# Patient Record
Sex: Male | Born: 1995 | Race: White | Hispanic: No | Marital: Single | Smoking: Never smoker
Health system: Southern US, Community
[De-identification: ages and names within clinical notes are randomized; demographics above are authoritative.]

---
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
## 2017-01-15 ENCOUNTER — Ambulatory Visit (INDEPENDENT_AMBULATORY_CARE_PROVIDER_SITE_OTHER): Payer: 59 | Admitting: Family Medicine

## 2017-01-15 ENCOUNTER — Encounter: Payer: Self-pay | Admitting: Family Medicine

## 2017-01-15 VITALS — BP 118/78 | HR 99 | Temp 98.2°F | Resp 18 | Ht 67.5 in | Wt 156.6 lb

## 2017-01-15 DIAGNOSIS — Z113 Encounter for screening for infections with a predominantly sexual mode of transmission: Secondary | ICD-10-CM

## 2017-01-15 DIAGNOSIS — Z0000 Encounter for general adult medical examination without abnormal findings: Secondary | ICD-10-CM | POA: Diagnosis not present

## 2017-01-15 DIAGNOSIS — R197 Diarrhea, unspecified: Secondary | ICD-10-CM | POA: Diagnosis not present

## 2017-01-15 NOTE — Progress Notes (Signed)
Name: Fernando Solomon   MRN: [id number]    DOB: 07-Sep-1996   Date:01/15/2017       Progress Note  Subjective  Chief Complaint  Chief Complaint  Patient presents with  . Annual Exam  . Flu Vaccine    pt refused  . Diarrhea    HPI  Male Exam: he is not currently sexually active, he had one sexual partner in the past - first intercourse at age 21, but not currently dating. He states she was a virgin. No penile discharge, no rashes, no dysuria or blood in urine.   Diarrhea: he was feeling well [place]. He never had nausea or vomiting or abdominal pain, he noticed watery stools up to three times a day, he still has episodes of bowel movements during the night. He went to Urgent Care near his college, had stools studies and blood work that per patient was negative, but he continues to have symptoms. He states his roommate is now having same symptoms. 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   There are no active problems to display for this patient.   History reviewed. No pertinent surgical history.  Family History  Problem Relation Age of Onset  . Diabetes Father   . Autism Brother     Social History   Social History  . Marital status: Single    Spouse name: N/A  . Number of children: N/A  . Years of education: N/A   Occupational History  . Not on file.   Social History Main Topics  . Smoking status: Never Smoker  . Smokeless tobacco: Never Used  . Alcohol use No  . Drug use: No     Comment: tried pot  . Sexual activity: Not Currently    Birth control/ protection: Condom   Other Topics Concern  . Not on file   Social History Narrative   Started college Fall 2017 [facility] [employer]   Major in biology and minor in agriculture    No current outpatient prescriptions on file.  No Known Allergies   ROS  Constitutional:  Negative for fever or weight change.  Respiratory: Negative for cough and shortness of breath.   Cardiovascular: Negative for chest pain or palpitations.  Gastrointestinal: Negative for abdominal pain, change in  bowel changes.  Musculoskeletal: Negative for gait problem or joint swelling.  Skin: Negative for rash.  Neurological: Negative for dizziness or headache.  No other specific complaints in a complete review of systems (except as listed in HPI above).  Objective  Vitals:   01/15/17 1149  BP: 118/78  Pulse: 99  Resp: 18  Temp: 98.2 F (36.8 C)  SpO2: 98%  Weight: 156 lb 9 oz (71 kg)  Height: 5' 7.5" (1.715 m)    Body mass index is 24.16 kg/m.  Physical Exam  Constitutional: Patient appears well-developed and well-nourished. No distress.  HENT: Head: Normocephalic and atraumatic. Ears: B TMs ok, no erythema or effusion; Nose: Nose normal. Mouth/Throat: Oropharynx is clear and moist. No oropharyngeal exudate.  Eyes: Conjunctivae and EOM are normal. Pupils are equal, round, and reactive to light. No scleral icterus.  Neck: Normal range of motion. Neck supple. No JVD present. No thyromegaly present.  Cardiovascular: Normal rate, regular rhythm and normal heart sounds.  No murmur heard. No BLE edema. Pulmonary/Chest: Effort normal and breath  sounds normal. No respiratory distress. Abdominal: Soft. Bowel sounds are normal, no distension. There is no tenderness. no masses MALE GENITALIA: Normal descended testes bilaterally, no masses palpated, no hernias, no lesions, no discharge RECTAL: not done Musculoskeletal: Normal range of motion, no joint effusions. No gross deformities Neurological: he is alert and oriented to person, place, and time. No cranial nerve deficit. Coordination, balance, strength, speech and gait are normal.  Skin: Skin is warm and dry. No rash noted. No erythema.  Psychiatric: Patient has a normal mood and affect. behavior is normal. Judgment and thought  content normal.  PHQ2/9:  Depression screen PHQ 2/9 01/15/2017  Decreased Interest 0  Down, Depressed, Hopeless 0  PHQ - 2 Score 0  Altered sleeping 0  Tired, decreased energy 0  Change in appetite 0  Feeling bad or failure about yourself  0  Trouble concentrating 0  Moving slowly or fidgety/restless 0  Suicidal thoughts 0  PHQ-9 Score 0      Assessment & Plan  1. Annual physical exam  Discussed importance of 150 minutes of physical activity weekly, eat two servings of fish weekly, eat one serving of tree nuts ( cashews, pistachios, pecans, almonds.Marland Kitchen) every other day, eat 6 servings of fruit/vegetables daily and drink plenty of water and avoid sweet beverages.   2. Routine screening for STI (sexually transmitted infection)  - HIV antibody - RPR - GC Probe amplification, urine   3. Diarrhea, unspecified type  He will mail or fax me a copy of his blood work and stool studies done at Urgent care, we will get DRG done and treat with empirical antibiotics if studies negative.   Discussed TSH, celiac disease and CBC but he would like to hold off for now

## 2017-01-16 LAB — GC/CHLAMYDIA PROBE AMP
CT PROBE, AMP APTIMA: NOT DETECTED
GC Probe RNA: NOT DETECTED

## 2017-01-17 ENCOUNTER — Encounter: Payer: Self-pay | Admitting: Family Medicine

## 2017-01-24 DIAGNOSIS — K591 Functional diarrhea: Secondary | ICD-10-CM | POA: Diagnosis not present

## 2017-03-03 ENCOUNTER — Other Ambulatory Visit: Payer: Self-pay | Admitting: Family Medicine

## 2017-03-03 ENCOUNTER — Telehealth: Payer: Self-pay | Admitting: Family Medicine

## 2017-03-03 DIAGNOSIS — R197 Diarrhea, unspecified: Secondary | ICD-10-CM

## 2017-03-03 NOTE — Telephone Encounter (Signed)
Patient would like a referral to see GI Specialist Dr. Mechele Collin for bowel issues.  He is still experiencing diarrhea.

## 2017-04-08 DIAGNOSIS — K529 Noninfective gastroenteritis and colitis, unspecified: Secondary | ICD-10-CM | POA: Diagnosis not present

## 2017-09-11 ENCOUNTER — Other Ambulatory Visit: Payer: Self-pay | Admitting: Family Medicine

## 2017-09-12 ENCOUNTER — Encounter: Payer: Self-pay | Admitting: Family Medicine

## 2018-03-16 ENCOUNTER — Encounter: Payer: Self-pay | Admitting: Family Medicine

## 2018-03-16 ENCOUNTER — Ambulatory Visit (INDEPENDENT_AMBULATORY_CARE_PROVIDER_SITE_OTHER): Payer: 59 | Admitting: Family Medicine

## 2018-03-16 VITALS — BP 100/60 | HR 87 | Temp 98.3°F | Resp 14 | Ht 67.0 in | Wt 147.0 lb

## 2018-03-16 DIAGNOSIS — Z0000 Encounter for general adult medical examination without abnormal findings: Secondary | ICD-10-CM | POA: Diagnosis not present

## 2018-03-16 DIAGNOSIS — A64 Unspecified sexually transmitted disease: Secondary | ICD-10-CM | POA: Diagnosis not present

## 2018-03-16 DIAGNOSIS — E739 Lactose intolerance, unspecified: Secondary | ICD-10-CM | POA: Insufficient documentation

## 2018-03-16 DIAGNOSIS — Z9189 Other specified personal risk factors, not elsewhere classified: Secondary | ICD-10-CM | POA: Insufficient documentation

## 2018-03-16 DIAGNOSIS — Z23 Encounter for immunization: Secondary | ICD-10-CM

## 2018-03-16 NOTE — Progress Notes (Signed)
Name: Fernando Solomon   MRN: [id number]    DOB: 1996/09/05   Date:03/16/2018       Progress Note  Subjective  Chief Complaint  Chief Complaint  Patient presents with  . Annual Exam    HPI  Patient presents for annual CPE   USPSTF grade A and B recommendations:  Diet: balanced Exercise: advised to increase when on campus, very active during the Summer  Depression:  Depression screen [facility] 2/9 03/16/2018 01/15/2017  Decreased Interest 0 0  Down, Depressed, Hopeless 0 0  PHQ - 2 Score 0 0  Altered sleeping - 0  Tired, decreased energy - 0  Change in appetite - 0  Feeling bad or failure about yourself  - 0  Trouble concentrating - 0  Moving slowly or fidgety/restless - 0  Suicidal thoughts - 0  PHQ-9 Score - 0    Hypertension:  BP Readings from Last 3 Encounters:  03/16/18 100/60  01/15/17 118/78  09/28/15 (!) 98/62 (4 %, Z = -1.80 /  27 %, Z = -0.61)*   *BP percentiles are based on the August 2017 AAP Clinical Practice Guideline for boys    Obesity: Wt Readings from Last 3 Encounters:  03/16/18 147 lb (66.7 kg)  01/15/17 156 lb 9 oz (71 kg) (57 %, Z= 0.18)*  09/28/15 138 lb 5 oz (62.7 kg) (36 %, Z= -0.35)*   * Growth percentiles are based on CDC (Boys, 2-20 Years) data.   BMI Readings from Last 3 Encounters:  03/16/18 23.02 kg/m  01/15/17 24.16 kg/m (70 %, Z= 0.52)*  09/28/15 21.66 kg/m (50 %, Z= 0.01)*   * Growth percentiles are based on CDC (Boys, 2-20 Years) data.      Alcohol: discussed avoidance of drinking and driving, use Uber Tobacco use: discussed   Single STD testing and prevention (chl/gon/syphilis): today  HIV, hep C: check today , discussed importance of condom  Advanced Care Planning: A voluntary discussion about advance care planning including the explanation and discussion of advance directives.  Discussed health care proxy and Living will, and the patient was able to identify a health care proxy as parents  Patient does not have a living  will at present time.    Patient Active Problem List   Diagnosis Date Noted  . At risk for HIV due to homosexual contact 03/16/2018  . Lactose intolerance in adult 03/16/2018    History reviewed. No pertinent surgical history.  Family History  Problem Relation Age of Onset  . Diabetes Father   . Autism Brother     Social History   Socioeconomic History  . Marital status: Single    Spouse name: 0  . Number of children: 0  . Years of education: Not on file  . Highest education level: Some college, no degree  Occupational History  . Occupation: [employer]  . Financial resource strain: Not hard at all  . Food insecurity:    Worry: Never true    Inability: Never true  . Transportation needs:    Medical: No    Non-medical: No  Tobacco Use  . Smoking status: Never Smoker  . Smokeless tobacco: Current User    Types: Snuff  Substance and Sexual Activity  . Alcohol use: No    Alcohol/week: 0.0 oz  . Drug use: No    Comment: tried pot  . Sexual activity: Not Currently    Partners: Male    Birth control/protection: Condom  Lifestyle  . Physical  activity:    Days per week: 7 days    Minutes per session: 150+ min  . Stress: Not at all  Relationships  . Social connections:    Talks on phone: More than three times a week    Gets together: More than three times a week    Attends religious service: More than 4 times per year    Active member of club or organization: Yes    Attends meetings of clubs or organizations: More than 4 times per year    Relationship status: Never married  . Intimate partner violence:    Fear of current or ex partner: No    Emotionally abused: No    Physically abused: No    Forced sexual activity: No  Other Topics Concern  . Not on file  Social History Narrative   Started college Fall 2017 at [employer]  - [employer]   Major in biology and minor in agriculture    No current outpatient medications on file.  Allergies   Allergen Reactions  . Lactose Intolerance (Gi) Other (See Comments)     ROS   Constitutional: Negative for fever or weight change.  Respiratory: Negative for cough and shortness of breath.   Cardiovascular: Negative for chest pain or palpitations.  Gastrointestinal: Negative for abdominal pain, no bowel changes.  Musculoskeletal: Negative for gait problem or joint swelling.  Skin: Negative for rash.  Neurological: Negative for dizziness or headache.  No other specific complaints in a complete review of systems (except as listed in HPI above).   Objective  Vitals:   03/16/18 1024  BP: 100/60  Pulse: 87  Resp: 14  Temp: 98.3 F (36.8 C)  TempSrc: Oral  SpO2: 98%  Weight: 147 lb (66.7 kg)  Height:  (1.702 m)    Body mass index is 23.02 kg/m.  Physical Exam  Constitutional: Patient appears well-developed and well-nourished. No distress.  HENT: Head: Normocephalic and atraumatic. Ears: B TMs ok, no erythema or effusion; Nose: Nose normal. Mouth/Throat: Oropharynx is clear and moist. No oropharyngeal exudate.  Eyes: Conjunctivae and EOM are normal. Pupils are equal, round, and reactive to light. No scleral icterus.  Neck: Normal range of motion. Neck supple. No JVD present. No thyromegaly present.  Cardiovascular: Normal rate, regular rhythm and normal heart sounds.  No murmur heard. No BLE edema. Pulmonary/Chest: Effort normal and breath sounds normal. No respiratory distress. Abdominal: Soft. Bowel sounds are normal, no distension. There is no tenderness. no masses MALE GENITALIA: Normal descended testes bilaterally, no masses palpated, no hernias, no lesions, no discharge RECTAL: not done Musculoskeletal: Normal range of motion, no joint effusions. No gross deformities Neurological: he is alert and oriented to person, place, and time. No cranial nerve deficit. Coordination, balance, strength, speech and gait are normal.  Skin: Skin is warm and dry. No rash noted.  No erythema.  Psychiatric: Patient has a normal mood and affect. behavior is normal. Judgment and thought content normal.    PHQ2/9: Depression screen [facility] 2/9 03/16/2018 01/15/2017  Decreased Interest 0 0  Down, Depressed, Hopeless 0 0  PHQ - 2 Score 0 0  Altered sleeping - 0  Tired, decreased energy - 0  Change in appetite - 0  Feeling bad or failure about yourself  - 0  Trouble concentrating - 0  Moving slowly or fidgety/restless - 0  Suicidal thoughts - 0  PHQ-9 Score - 0     Fall Risk: Fall Risk  03/16/2018  Falls  in the past year? No     Functional Status Survey: Is the patient deaf or have difficulty hearing?: No Does the patient have difficulty seeing, even when wearing glasses/contacts?: No Does the patient have difficulty concentrating, remembering, or making decisions?: No Does the patient have difficulty walking or climbing stairs?: No Does the patient have difficulty dressing or bathing?: No Does the patient have difficulty doing errands alone such as visiting a doctor's office or shopping?: No   Assessment & Plan  1. Encounter for routine history and physical exam for male  Discussed importance of 150 minutes of physical activity weekly, eat two servings of fish weekly, eat one serving of tree nuts ( cashews, pistachios, pecans, almonds.Marland Kitchen) every other day, eat 6 servings of fruit/vegetables daily and drink plenty of water and avoid sweet beverages.   2. At risk for HIV due to homosexual contact  Discussed medication if he has exposure, also discussed symptoms if he gets exposed   3. STI (sexually transmitted infection)  - HIV antibody - RPR - Hepatitis, Acute - C. trachomatis/N. gonorrhoeae RNA  4. Need for HPV vaccination  - HPV 9-valent vaccine,Recombinat  5. Need for Tdap vaccination  - Tdap vaccine greater than or equal to 7yo IM  6. Lactose intolerance in adult  Discussed lactaid and avoidance, but needs to have other sources of calcium

## 2018-03-16 NOTE — Patient Instructions (Signed)
Preventive Care 18-39 Years, Male Preventive care refers to lifestyle choices and visits with your health care provider that can promote health and wellness. What does preventive care include?  A yearly physical exam. This is also called an annual well check.  Dental exams once or twice a year.  Routine eye exams. Ask your health care provider how often you should have your eyes checked.  Personal lifestyle choices, including: ? Daily care of your teeth and gums. ? Regular physical activity. ? Eating a healthy diet. ? Avoiding tobacco and drug use. ? Limiting alcohol use. ? Practicing safe sex. What happens during an annual well check? The services and screenings done by your health care provider during your annual well check will depend on your age, overall health, lifestyle risk factors, and family history of disease. Counseling Your health care provider may ask you questions about your:  Alcohol use.  Tobacco use.  Drug use.  Emotional well-being.  Home and relationship well-being.  Sexual activity.  Eating habits.  Work and work Statistician.  Screening You may have the following tests or measurements:  Height, weight, and BMI.  Blood pressure.  Lipid and cholesterol levels. These may be checked every 5 years starting at age 34.  Diabetes screening. This is done by checking your blood sugar (glucose) after you have not eaten for a while (fasting).  Skin check.  Hepatitis C blood test.  Hepatitis B blood test.  Sexually transmitted disease (STD) testing.  Discuss your test results, treatment options, and if necessary, the need for more tests with your health care provider. Vaccines Your health care provider may recommend certain vaccines, such as:  Influenza vaccine. This is recommended every year.  Tetanus, diphtheria, and acellular pertussis (Tdap, Td) vaccine. You may need a Td booster every 10 years.  Varicella vaccine. You may need this if you  have not been vaccinated.  HPV vaccine. If you are 23 or younger, you may need three doses over 6 months.  Measles, mumps, and rubella (MMR) vaccine. You may need at least one dose of MMR.You may also need a second dose.  Pneumococcal 13-valent conjugate (PCV13) vaccine. You may need this if you have certain conditions and have not been vaccinated.  Pneumococcal polysaccharide (PPSV23) vaccine. You may need one or two doses if you smoke cigarettes or if you have certain conditions.  Meningococcal vaccine. One dose is recommended if you are age 65-21 years and a first-year college student living in a residence hall, or if you have one of several medical conditions. You may also need additional booster doses.  Hepatitis A vaccine. You may need this if you have certain conditions or if you travel or work in places where you may be exposed to hepatitis A.  Hepatitis B vaccine. You may need this if you have certain conditions or if you travel or work in places where you may be exposed to hepatitis B.  Haemophilus influenzae type b (Hib) vaccine. You may need this if you have certain risk factors.  Talk to your health care provider about which screenings and vaccines you need and how often you need them. This information is not intended to replace advice given to you by your health care provider. Make sure you discuss any questions you have with your health care provider. Document Released: 12/24/2001 Document Revised: 07/17/2016 Document Reviewed: 08/29/2015 Elsevier Interactive Patient Education  [employer].

## 2018-03-17 LAB — HEPATITIS PANEL, ACUTE
HEP B C IGM: NONREACTIVE
Hep A IgM: NONREACTIVE
Hepatitis B Surface Ag: NONREACTIVE
Hepatitis C Ab: NONREACTIVE
SIGNAL TO CUT-OFF: 0.01 (ref <1.00)

## 2018-03-17 LAB — RPR: RPR Ser Ql: NONREACTIVE

## 2018-03-17 LAB — C. TRACHOMATIS/N. GONORRHOEAE RNA
C. TRACHOMATIS RNA, TMA: NOT DETECTED
N. gonorrhoeae RNA, TMA: NOT DETECTED

## 2018-03-17 LAB — HIV ANTIBODY (ROUTINE TESTING W REFLEX): HIV: NONREACTIVE

## 2018-05-18 ENCOUNTER — Ambulatory Visit (INDEPENDENT_AMBULATORY_CARE_PROVIDER_SITE_OTHER): Payer: 59

## 2018-05-18 DIAGNOSIS — Z23 Encounter for immunization: Secondary | ICD-10-CM

## 2018-05-18 NOTE — Progress Notes (Signed)
hp

## 2018-07-06 ENCOUNTER — Encounter: Payer: Self-pay | Admitting: Family Medicine

## 2018-07-07 ENCOUNTER — Other Ambulatory Visit: Payer: Self-pay | Admitting: Family Medicine

## 2018-07-07 DIAGNOSIS — Z130 Encounter for screening for diseases of the blood and blood-forming organs and certain disorders involving the immune mechanism: Secondary | ICD-10-CM

## 2018-07-14 ENCOUNTER — Telehealth: Payer: Self-pay

## 2018-07-14 NOTE — Telephone Encounter (Unsigned)
Copied from CRM [id number]. 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

## 2018-07-14 NOTE — Telephone Encounter (Signed)
Copied from CRM [id number]. 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 Once it has been entered into his record, we will give him a call.

## 2018-09-21 ENCOUNTER — Ambulatory Visit: Payer: 59

## 2018-10-09 ENCOUNTER — Ambulatory Visit (INDEPENDENT_AMBULATORY_CARE_PROVIDER_SITE_OTHER): Payer: 59

## 2018-10-09 DIAGNOSIS — Z23 Encounter for immunization: Secondary | ICD-10-CM | POA: Diagnosis not present

## 2019-06-17 ENCOUNTER — Encounter: Payer: Self-pay | Admitting: Family Medicine

## 2019-10-26 ENCOUNTER — Encounter: Payer: Self-pay | Admitting: Family Medicine

## 2019-11-24 ENCOUNTER — Ambulatory Visit: Payer: Self-pay | Attending: Internal Medicine

## 2019-11-24 DIAGNOSIS — Z20822 Contact with and (suspected) exposure to covid-19: Secondary | ICD-10-CM | POA: Insufficient documentation

## 2019-11-25 LAB — NOVEL CORONAVIRUS, NAA: SARS-CoV-2, NAA: NOT DETECTED

## 2021-02-28 ENCOUNTER — Encounter: Payer: Self-pay | Admitting: Family Medicine

## 2021-02-28 ENCOUNTER — Other Ambulatory Visit: Payer: Self-pay

## 2021-02-28 ENCOUNTER — Ambulatory Visit (INDEPENDENT_AMBULATORY_CARE_PROVIDER_SITE_OTHER): Payer: No Typology Code available for payment source | Admitting: Family Medicine

## 2021-02-28 VITALS — BP 137/64 | HR 71 | Ht 68.0 in | Wt 170.0 lb

## 2021-02-28 DIAGNOSIS — Z7689 Persons encountering health services in other specified circumstances: Secondary | ICD-10-CM

## 2021-02-28 DIAGNOSIS — Z113 Encounter for screening for infections with a predominantly sexual mode of transmission: Secondary | ICD-10-CM

## 2021-02-28 DIAGNOSIS — Z7252 High risk homosexual behavior: Secondary | ICD-10-CM

## 2021-02-28 DIAGNOSIS — Z202 Contact with and (suspected) exposure to infections with a predominantly sexual mode of transmission: Secondary | ICD-10-CM

## 2021-02-28 NOTE — Progress Notes (Signed)
Subjective:    Patient ID: Fernando Solomon, male    DOB: 10-10-1996, 25 y.o.   MRN: [id number]  Fernando Solomon is a 25 y.o. male presenting on 02/28/2021 for Establish Care  He has received new health insurance w/ new employment, and here to re-establish, now no longer w/ health insurance from parents.   Previous PCP Dr Carlynn Purl at [place]. Last visit >3 years ago, cannot re-establish at this time.  HPI   High Risk Homosexual Behavior STD Screening Potential STD Exposure Today he reports that he is currently sexually active with male partners. Mostly giving oral sex, he has received anal intercourse in recent past as well. He is interested in STD screening / testing in near future. He does not endorse any STD symptoms actively at this time. He is interested in the future for PrEP therapy, but would like STD testing first to decide how to proceed. He has never been on PrEP. - he has prior testing in past in 2018 Gonorrhea/Chlamydia negative, 2019 had labs with HIV RPR negative, hepatitis panel. - he has had other sexual partners since that time and needs new testing.  Bloody Stools Reports past 1 month had some bloody stools, mixture of bright red blood and some active bleeding with wiping, and some darker blood if hadn't had BM further. Could have been triggered by recent anal intercourse that proceeded the bleeding. Unsure if he has had history of hemorrhoid before. He has increased fiber and this has resolved. No further bleeding past 1 week.    Depression screen [facility] 2/9 03/16/2018 01/15/2017  Decreased Interest 0 0  Down, Depressed, Hopeless 0 0  PHQ - 2 Score 0 0  Altered sleeping - 0  Tired, decreased energy - 0  Change in appetite - 0  Feeling bad or failure about yourself  - 0  Trouble concentrating - 0  Moving slowly or fidgety/restless - 0  Suicidal thoughts - 0  PHQ-9 Score - 0    History reviewed. No pertinent past medical history. History reviewed. No pertinent  surgical history. Social History   Socioeconomic History  . Marital status: Single    Spouse name: 0  . Number of children: 0  . Years of education: Not on file  . Highest education level: Some college, no degree  Occupational History  . Occupation: Farmer  Tobacco Use  . Smoking status: Never Smoker  . Smokeless tobacco: Current User    Types: Snuff  Vaping Use  . Vaping Use: Never used  Substance and Sexual Activity  . Alcohol use: No    Alcohol/week: 0.0 standard drinks  . Drug use: No    Comment: tried pot  . Sexual activity: Not Currently    Partners: Male    Birth control/protection: Condom  Other Topics Concern  . Not on file  Social History Narrative   Started college Fall 2017 at [employer]  - [employer]   Major in biology and minor in agriculture   Social Determinants of Health   Financial Resource Strain: Not on file  Food Insecurity: Not on file  Transportation Needs: Not on file  Physical Activity: Not on file  Stress: Not on file  Social Connections: Not on file  Intimate Partner Violence: Not on file   Family History  Problem Relation Age of Onset  . Diabetes Father   . Autism Brother   . Lung cancer Maternal Grandfather    No current outpatient medications on file prior to  visit.   No current facility-administered medications on file prior to visit.    Review of Systems Per HPI unless specifically indicated above      Objective:    BP 137/64   Pulse 71   Ht 5\' 8"  (1.727 m)   Wt 170 lb (77.1 kg)   SpO2 100%   BMI 25.85 kg/m   Wt Readings from Last 3 Encounters:  02/28/21 170 lb (77.1 kg)  03/16/18 147 lb (66.7 kg)  01/15/17 156 lb 9 oz (71 kg) (57 %, Z= 0.18)*   * Growth percentiles are based on CDC (Boys, 2-20 Years) data.    Physical Exam Vitals and nursing note reviewed.  Constitutional:      General: He is not in acute distress.    Appearance: He is well-developed. He is not diaphoretic.     Comments:  Well-appearing, comfortable, cooperative  HENT:     Head: Normocephalic and atraumatic.  Eyes:     General:        Right eye: No discharge.        Left eye: No discharge.     Conjunctiva/sclera: Conjunctivae normal.  Cardiovascular:     Rate and Rhythm: Normal rate.  Pulmonary:     Effort: Pulmonary effort is normal.  Skin:    General: Skin is warm and dry.     Findings: No erythema or rash.  Neurological:     Mental Status: He is alert and oriented to person, place, and time.  Psychiatric:        Behavior: Behavior normal.     Comments: Well groomed, good eye contact, normal speech and thoughts    Results for orders placed or performed in visit on 11/24/19  Novel Coronavirus, NAA ([facility])   Specimen: Nasopharyngeal(NP) swabs in vial transport medium   NASOPHARYNGE  TESTING  Result Value Ref Range   SARS-CoV-2, NAA Not Detected Not Detected      Assessment & Plan:   Problem List Items Addressed This Visit   None   Visit Diagnoses    High risk homosexual behavior    -  Primary   Relevant Orders   GC/Chlamydia probe amp ([place])not at [facility]   GC/CT Probe, Amp (Throat)   RPR   HIV Antibody (routine testing w rflx)   Hepatitis C antibody   Hepatitis B Core Antibody, total   Potential exposure to STD       Relevant Orders   GC/Chlamydia probe amp ([place])not at [facility]   GC/CT Probe, Amp (Throat)   RPR   HIV Antibody (routine testing w rflx)   Hepatitis C antibody   Hepatitis B Core Antibody, total   Screening examination for STD (sexually transmitted disease)       Relevant Orders   GC/Chlamydia probe amp ([place])not at [facility]   GC/CT Probe, Amp (Throat)   RPR   HIV Antibody (routine testing w rflx)   Hepatitis C antibody   Hepatitis B Core Antibody, total   Encounter to establish care with new doctor         STD Screening Multiple sexual partners, High Risk Homosexual Sexual Behavior Will need screening testing with blood for HIV, RPR, Hepatitis  panel, and Urine for GC / Chlamydia, Throat Swab as well for GC / Chlamydia - handout orders given to him with dx codes, he can check with new insurance on cost of testing, and return for labs when ready.  Also gave him info on PrEP, discussed today  and he is interested, will do STD testing first, and then we may decide to refer to ID Specialist for initiating PrEP and managing if he decides to proceed.  #Rectal Bleeding - Resolved Episode following anal intercourse. Possible hemorrhoid Now resolved, w/ fiber intake and no further provoked symptoms Asymptomatic.  Future can return for annual wellness physical if needed for insurance.  Orders Placed This Encounter  Procedures  . GC/CT Probe, Amp (Throat)    Use [employer] unisex GC/Chlamydia swab  . RPR  . HIV Antibody (routine testing w rflx)  . Hepatitis C antibody  . Hepatitis B Core Antibody, total      No orders of the defined types were placed in this encounter.     Follow up plan: Return in about 1 week (around 03/07/2021) for within 1-2 weeks - Non fasting lab only in AM, Urine test, Throat swab.  Saralyn Pilar, DO [facility] 02/28/2021, 3:22 PM

## 2021-02-28 NOTE — Patient Instructions (Addendum)
Thank you for coming to the office today.  For PrEP we usually start by referring to an Infectious Disease specialist.  Infectious Disease Clinic at [facility] [address] [place], [place] [address] [phone]  ------  STD Testing  Diagnosis Codes:  Come back for a lab only visit, first thing in morning when ready for blood, 1st or 2nd urine of the day, and a throat swab.   Please schedule a Follow-up Appointment to: Return in about 1 week (around 03/07/2021) for within 1-2 weeks - Non fasting lab only in AM, Urine test, Throat swab.  If you have any other questions or concerns, please feel free to call the office or send a message through MyChart. You may also schedule an earlier appointment if necessary.  Additionally, you may be receiving a survey about your experience at our office within a few days to 1 week by e-mail or mail. We value your feedback.  Saralyn Pilar, DO [facility], [place]

## 2021-08-15 ENCOUNTER — Emergency Department
Admission: EM | Admit: 2021-08-15 | Discharge: 2021-08-15 | Disposition: A | Discharge status: Home / Self Care | Payer: No Typology Code available for payment source | Attending: Emergency Medicine | Admitting: Emergency Medicine

## 2021-08-15 ENCOUNTER — Encounter: Payer: Self-pay | Admitting: *Deleted

## 2021-08-15 ENCOUNTER — Emergency Department: Payer: No Typology Code available for payment source

## 2021-08-15 ENCOUNTER — Other Ambulatory Visit: Payer: Self-pay

## 2021-08-15 DIAGNOSIS — R519 Headache, unspecified: Secondary | ICD-10-CM | POA: Insufficient documentation

## 2021-08-15 DIAGNOSIS — M542 Cervicalgia: Secondary | ICD-10-CM | POA: Insufficient documentation

## 2021-08-15 DIAGNOSIS — F17220 Nicotine dependence, chewing tobacco, uncomplicated: Secondary | ICD-10-CM | POA: Insufficient documentation

## 2021-08-15 DIAGNOSIS — Y92410 Unspecified street and highway as the place of occurrence of the external cause: Secondary | ICD-10-CM | POA: Insufficient documentation

## 2021-08-15 MED ORDER — MELOXICAM 15 MG PO TABS
15.0000 mg | ORAL_TABLET | Freq: Every day | ORAL | 2 refills | Status: AC
  Filled 2021-08-15: qty 30, 30d supply, fill #0

## 2021-08-15 MED ORDER — METHOCARBAMOL 500 MG PO TABS
500.0000 mg | ORAL_TABLET | Freq: Three times a day (TID) | ORAL | 0 refills | Status: AC | PRN
  Filled 2021-08-15: qty 15, 5d supply, fill #0
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
## 2021-08-15 NOTE — Discharge Instructions (Signed)
You can take meloxicam and Robaxin as directed.  ?

## 2021-08-16 ENCOUNTER — Other Ambulatory Visit: Payer: Self-pay

## 2022-12-27 ENCOUNTER — Telehealth: Payer: Self-pay | Admitting: Family Medicine
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
## 2023-01-21 ENCOUNTER — Ambulatory Visit: Payer: 59 | Admitting: Family Medicine

## 2023-01-31 ENCOUNTER — Ambulatory Visit: Payer: 59 | Admitting: Family Medicine

## 2023-02-03 ENCOUNTER — Other Ambulatory Visit: Payer: Self-pay | Admitting: Family Medicine

## 2023-02-03 ENCOUNTER — Ambulatory Visit (INDEPENDENT_AMBULATORY_CARE_PROVIDER_SITE_OTHER): Payer: 59 | Admitting: Family Medicine

## 2023-02-03 VITALS — BP 132/70 | HR 63 | Ht 68.0 in | Wt 166.6 lb

## 2023-02-03 DIAGNOSIS — Z9189 Other specified personal risk factors, not elsewhere classified: Secondary | ICD-10-CM | POA: Diagnosis not present

## 2023-02-03 DIAGNOSIS — K602 Anal fissure, unspecified: Secondary | ICD-10-CM | POA: Diagnosis not present

## 2023-02-03 DIAGNOSIS — IMO0001 Reserved for inherently not codable concepts without codable children: Secondary | ICD-10-CM

## 2023-02-03 DIAGNOSIS — Z0000 Encounter for general adult medical examination without abnormal findings: Secondary | ICD-10-CM

## 2023-02-03 DIAGNOSIS — Z7252 High risk homosexual behavior: Secondary | ICD-10-CM | POA: Diagnosis not present

## 2023-02-03 NOTE — Progress Notes (Signed)
Subjective:    Patient ID: Fernando Solomon, male    DOB: 11-Aug-1996, 27 y.o.   MRN: [id number]  Fernando Solomon is a 27 y.o. 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 He requests HIV lab test and he is asking on cost coverage on other STD panel  He is due for annual yearly wellness and will schedule this in future.     02/03/2023    1:35 PM 03/16/2018   10:25 AM 01/15/2017   12:48 PM  Depression screen PHQ 2/9  Decreased Interest 0 0 0  Down, Depressed, Hopeless 0 0 0  PHQ - 2 Score 0 0 0  Altered sleeping 0  0  Tired, decreased energy 0  0  Change in appetite 0  0  Feeling bad or failure about yourself  0  0  Trouble concentrating 0  0  Moving slowly or fidgety/restless 0  0  Suicidal thoughts 0  0  PHQ-9 Score 0  0  Difficult doing work/chores Not difficult at all      Social History   Tobacco Use   Smoking status: Never   Smokeless tobacco: Current    Types: Snuff  Vaping Use   Vaping Use: Never used  Substance Use Topics   Alcohol use: No    Alcohol/week: 0.0 standard drinks of alcohol   Drug use: No    Comment: tried pot    Review of Systems Per HPI unless specifically indicated above     Objective:    BP 132/70   Pulse 63   Ht 5\' 8"  (1.727 m)   Wt 166 lb 9.6 oz (75.6 kg)   SpO2 98%   BMI 25.33 kg/m   Wt Readings from Last 3 Encounters:  02/03/23 166 lb 9.6 oz (75.6 kg)  08/15/21 165 lb (74.8 kg)  02/28/21 170 lb (77.1 kg)    Physical Exam Vitals and nursing note reviewed.  Constitutional:       General: He is not in acute distress.    Appearance: Normal appearance. He is well-developed. He is not diaphoretic.     Comments: Well-appearing, comfortable, cooperative  HENT:     Head: Normocephalic and atraumatic.  Eyes:     General:        Right eye: No discharge.        Left eye: No discharge.     Conjunctiva/sclera: Conjunctivae normal.  Cardiovascular:     Rate and Rhythm: Normal rate.  Pulmonary:     Effort: Pulmonary effort is normal.  Genitourinary:    Comments: Deferred rectal exam toady Skin:    General: Skin is warm and dry.     Findings: No erythema or rash.  Neurological:     Mental Status: He is alert and oriented to person, place, and time.  Psychiatric:        Mood and Affect: Mood normal.        Behavior: Behavior normal.        Thought Content: Thought content normal.  Comments: Well groomed, good eye contact, normal speech and thoughts       Results for orders placed or performed in visit on 11/24/19  Novel Coronavirus, NAA ([facility])   Specimen: Nasopharyngeal(NP) swabs in vial transport medium   NASOPHARYNGE  TESTING  Result Value Ref Range   SARS-CoV-2, NAA Not Detected Not Detected      Assessment & Plan:   Problem List Items Addressed This Visit     At risk for HIV due to homosexual contact   Relevant Orders   HIV Antibody (routine testing w rflx)   Other Visit Diagnoses     High risk homosexual behavior    -  Primary   Relevant Orders   HIV Antibody (routine testing w rflx)   Anal fissure       Relevant Medications   diltiazem 2 % GEL       Anal fissure Acute on chronic flare Previously managed with OTC remedy topical, preparation H, stool softener, sitz bath  Rx Diltiazem 2% gel THREE TIMES A DAY for 4-8 weeks as needed to help heal. 60 g amount  Phoned in rx to [facility] in [place] and they will contact patient confirm price and orders and will have ready within 2-3 business days or faster.  Future can  reconsider [facility] in [place] if needed.  Check lab HIV test today for routine screening, no new known exposure or other concerns.  He will go to [facility] for rest of STD panel screening as needed going forward. He is asymptomatic now. He declines additional lab testing today due to concern if any additional fee or cost w/ insurance. Discussed should be covered, or can check with insurance online or on phone.  Return for annual physical 6 weeks and routine labs  Orders Placed This Encounter  Procedures   HIV Antibody (routine testing w rflx)     No orders of the defined types were placed in this encounter.    Follow up plan: Return in about 6 weeks (around 03/17/2023) for 6 weeks fasting lab only then 1 week later Annual Physical.  Future labs ordered for 03/24/23   Nobie Putnam, [facility] 02/03/2023, 1:52 PM

## 2023-02-03 NOTE — Patient Instructions (Addendum)
Thank you for coming to the office today.  HIV lab test today  Other STD screening tests to check with insurance  RPR = Syphilis blood test  Gonorrhea/Chlamydia = urine test or a swab test  [facility] Department for other STD screening / & treatment if needed  --------------------------  Diltiazem gel topical  [facility] Address: [address], [place], [place] [address] Phone: [phone]  [place] in: [facility] Address: [address] [place], [place] [address] Phone: [phone]    DUE for FASTING BLOOD WORK (no food or drink after midnight before the lab appointment, only water or coffee without cream/sugar on the morning of)  SCHEDULE "Lab Only" visit in the morning at the clinic for lab draw in 4-6 WEEKS   - Make sure Lab Only appointment is at about 1 week before your next appointment, so that results will be available  For Lab Results, once available within 2-3 days of blood draw, you can can log in to MyChart online to view your results and a brief explanation. Also, we can discuss results at next follow-up visit.   Please schedule a Follow-up Appointment to: Return in about 6 weeks (around 03/17/2023) for 6 weeks fasting lab only then 1 week later Annual Physical.  If you have any other questions or concerns, please feel free to call the office or send a message through [place]. You may also schedule an earlier appointment if necessary.  Additionally, you may be receiving a survey about your experience at our office within a few days to 1 week by e-mail or mail. We value your feedback.  Nobie Putnam, DO [place]

## 2023-02-04 LAB — HIV ANTIBODY (ROUTINE TESTING W REFLEX): HIV 1&2 Ab, 4th Generation: NONREACTIVE

## 2023-02-05 ENCOUNTER — Encounter: Payer: Self-pay | Admitting: Family Medicine

## 2023-03-24 ENCOUNTER — Other Ambulatory Visit: Payer: 59

## 2023-03-24 DIAGNOSIS — Z0000 Encounter for general adult medical examination without abnormal findings: Secondary | ICD-10-CM

## 2023-03-25 LAB — COMPLETE METABOLIC PANEL WITH GFR
AG Ratio: 1.8 (calc) (ref 1.0–2.5)
ALT: 17 U/L (ref 9–46)
AST: 18 U/L (ref 10–40)
Albumin: 4.3 g/dL (ref 3.6–5.1)
Alkaline phosphatase (APISO): 53 U/L (ref 36–130)
BUN: 15 mg/dL (ref 7–25)
CO2: 30 mmol/L (ref 20–32)
Calcium: 9.9 mg/dL (ref 8.6–10.3)
Chloride: 104 mmol/L (ref 98–110)
Creat: 0.9 mg/dL (ref 0.60–1.24)
Globulin: 2.4 g/dL (calc) (ref 1.9–3.7)
Glucose, Bld: 101 mg/dL — ABNORMAL HIGH (ref 65–99)
Potassium: 4.1 mmol/L (ref 3.5–5.3)
Sodium: 142 mmol/L (ref 135–146)
Total Bilirubin: 0.9 mg/dL (ref 0.2–1.2)
Total Protein: 6.7 g/dL (ref 6.1–8.1)
eGFR: 122 mL/min/{1.73_m2} (ref >=60)

## 2023-03-25 LAB — LIPID PANEL
Cholesterol: 187 mg/dL (ref <200)
HDL: 61 mg/dL (ref >=40)
LDL Cholesterol (Calc): 105 mg/dL (calc) — ABNORMAL HIGH
Non-HDL Cholesterol (Calc): 126 mg/dL (calc) (ref <130)
Total CHOL/HDL Ratio: 3.1 (calc) (ref <5.0)
Triglycerides: 111 mg/dL (ref <150)

## 2023-03-25 LAB — CBC WITH DIFFERENTIAL/PLATELET
Absolute Monocytes: 794 cells/uL (ref 200–950)
Basophils Absolute: 59 cells/uL (ref 0–200)
Basophils Relative: 0.6 %
Eosinophils Absolute: 206 cells/uL (ref 15–500)
Eosinophils Relative: 2.1 %
HCT: 42.8 % (ref 38.5–50.0)
Hemoglobin: 14.4 g/dL (ref 13.2–17.1)
Lymphs Abs: 2754 cells/uL (ref 850–3900)
MCH: 29.3 pg (ref 27.0–33.0)
MCHC: 33.6 g/dL (ref 32.0–36.0)
MCV: 87 fL (ref 80.0–100.0)
MPV: 10.2 fL (ref 7.5–12.5)
Monocytes Relative: 8.1 %
Neutro Abs: 5988 cells/uL (ref 1500–7800)
Neutrophils Relative %: 61.1 %
Platelets: 236 10*3/uL (ref 140–400)
RBC: 4.92 10*6/uL (ref 4.20–5.80)
RDW: 13 % (ref 11.0–15.0)
Total Lymphocyte: 28.1 %
WBC: 9.8 10*3/uL (ref 3.8–10.8)

## 2023-03-26 ENCOUNTER — Ambulatory Visit: Payer: 59 | Admitting: Physician Assistant

## 2023-03-31 ENCOUNTER — Encounter: Payer: Self-pay | Admitting: Family Medicine

## 2023-03-31 ENCOUNTER — Ambulatory Visit (INDEPENDENT_AMBULATORY_CARE_PROVIDER_SITE_OTHER): Payer: 59 | Admitting: Family Medicine

## 2023-03-31 ENCOUNTER — Other Ambulatory Visit: Payer: Self-pay | Admitting: Family Medicine

## 2023-03-31 VITALS — BP 130/70 | HR 76 | Ht 68.0 in | Wt 161.0 lb

## 2023-03-31 DIAGNOSIS — Z7252 High risk homosexual behavior: Secondary | ICD-10-CM

## 2023-03-31 DIAGNOSIS — Z114 Encounter for screening for human immunodeficiency virus [HIV]: Secondary | ICD-10-CM

## 2023-03-31 DIAGNOSIS — Z0000 Encounter for general adult medical examination without abnormal findings: Secondary | ICD-10-CM

## 2023-03-31 DIAGNOSIS — E7800 Pure hypercholesterolemia, unspecified: Secondary | ICD-10-CM

## 2023-03-31 DIAGNOSIS — Z9189 Other specified personal risk factors, not elsewhere classified: Secondary | ICD-10-CM

## 2023-03-31 DIAGNOSIS — R7309 Other abnormal glucose: Secondary | ICD-10-CM

## 2023-03-31 NOTE — Patient Instructions (Addendum)
Thank you for coming to the office today.  Keep up the good work  We will double check the A1c 3 month average sugar next year, nothing to worry about. But ever so slightly higher glucose.  Keep up with healthy diet, limit excess fried fatty / high fat dairy.   DUE for FASTING BLOOD WORK (no food or drink after midnight before the lab appointment, only water or coffee without cream/sugar on the morning of)  SCHEDULE "Lab Only" visit in the morning at the clinic for lab draw in 1 YEAR  - Make sure Lab Only appointment is at about 1 week before your next appointment, so that results will be available  For Lab Results, once available within 2-3 days of blood draw, you can can log in to MyChart online to view your results and a brief explanation. Also, we can discuss results at next follow-up visit.   Please schedule a Follow-up Appointment to: Return in about 1 year (around 03/30/2024) for 1 year fasting lab only then 1 week later Annual Physical.  If you have any other questions or concerns, please feel free to call the office or send a message through MyChart. You may also schedule an earlier appointment if necessary.  Additionally, you may be receiving a survey about your experience at our office within a few days to 1 week by e-mail or mail. We value your feedback.  Saralyn Pilar, DO [facility], [place]

## 2023-03-31 NOTE — Progress Notes (Signed)
Subjective:    Patient ID: Fernando Solomon, male    DOB: 1996/10/10, 27 y.o.   MRN: [id number]  Fernando Solomon is a 27 y.o. male presenting on 03/31/2023 for Annual Exam   HPI  Here for Annual Physical and Lab Review  Wellness Lifestyle Weight has been stable to improved Exercise - working on feet regularly, physically active daily Diet - balanced diet, skipping breakfast, does consume dairy often, rice pork chicken  Updates Anal Fissure - healed Recent discussion 01/2023, he used Diltiazem compounded gel with resolution, resolved within 1 week. Has if he needs again in future for flares  Health Maintenance: UTD on vaccines, TDap     03/31/2023    3:38 PM 02/03/2023    1:35 PM 03/16/2018   10:25 AM  Depression screen PHQ 2/9  Decreased Interest 0 0 0  Down, Depressed, Hopeless 0 0 0  PHQ - 2 Score 0 0 0  Altered sleeping  0   Tired, decreased energy  0   Change in appetite  0   Feeling bad or failure about yourself   0   Trouble concentrating  0   Moving slowly or fidgety/restless  0   Suicidal thoughts  0   PHQ-9 Score  0   Difficult doing work/chores  Not difficult at all     History reviewed. No pertinent past medical history. History reviewed. No pertinent surgical history. Social History   Socioeconomic History   Marital status: Single    Spouse name: 0   Number of children: 0   Years of education: Not on file   Highest education level: Bachelor's degree (e.g., BA, AB, BS)  Occupational History   Occupation: Visual merchandiser  Tobacco Use   Smoking status: Never   Smokeless tobacco: Current    Types: Snuff  Vaping Use   Vaping Use: Never used  Substance and Sexual Activity   Alcohol use: No    Alcohol/week: 0.0 standard drinks of alcohol   Drug use: No    Comment: tried pot   Sexual activity: Not Currently    Partners: Male    Birth control/protection: Condom  Other Topics Concern   Not on file  Social History Narrative   Started college Fall 2017 at  [employer]  - [place]   Major in biology and minor in agriculture   Social Determinants of Health   Financial Resource Strain: Patient Declined (02/03/2023)   Overall Financial Resource Strain (CARDIA)    Difficulty of Paying Living Expenses: Patient declined  Food Insecurity: Patient Declined (02/03/2023)   Hunger Vital Sign    Worried About Running Out of Food in the Last Year: Patient declined    Ran Out of Food in the Last Year: Patient declined  Transportation Needs: No Transportation Needs (02/03/2023)   PRAPARE - Administrator, Civil Service (Medical): No    Lack of Transportation (Non-Medical): No  Physical Activity: Sufficiently Active (02/03/2023)   Exercise Vital Sign    Days of Exercise per Week: 7 days    Minutes of Exercise per Session: 150+ min  Stress: No Stress Concern Present (02/03/2023)   [employer] of Occupational Health - Occupational Stress Questionnaire    Feeling of Stress : Only a little  Social Connections: Moderately Integrated (02/03/2023)   Social Connection and Isolation Panel [NHANES]    Frequency of Communication with Friends and Family: More than three times a week    Frequency of Social Gatherings with Friends  and Family: Three times a week    Attends Religious Services: More than 4 times per year    Active Member of Clubs or Organizations: Yes    Attends Banker Meetings: More than 4 times per year    Marital Status: Never married  Intimate Partner Violence: Not At Risk (03/16/2018)   Humiliation, Afraid, Rape, and Kick questionnaire    Fear of Current or Ex-Partner: No    Emotionally Abused: No    Physically Abused: No    Sexually Abused: No   Family History  Problem Relation Age of Onset   Diabetes Father    Autism Brother    Lung cancer Maternal Grandfather    Current Outpatient Medications on File Prior to Visit  Medication Sig   diltiazem 2 % GEL Apply 1 Application topically 3 (three) times daily.    No current facility-administered medications on file prior to visit.    Review of Systems  Constitutional:  Negative for activity change, appetite change, chills, diaphoresis, fatigue and fever.  HENT:  Negative for congestion and hearing loss.   Eyes:  Negative for visual disturbance.  Respiratory:  Negative for cough, chest tightness, shortness of breath and wheezing.   Cardiovascular:  Negative for chest pain, palpitations and leg swelling.  Gastrointestinal:  Negative for abdominal pain, constipation, diarrhea, nausea and vomiting.  Genitourinary:  Negative for dysuria, frequency and hematuria.  Musculoskeletal:  Negative for arthralgias and neck pain.  Skin:  Negative for rash.  Neurological:  Negative for dizziness, weakness, light-headedness, numbness and headaches.  Hematological:  Negative for adenopathy.  Psychiatric/Behavioral:  Negative for behavioral problems, dysphoric mood and sleep disturbance.    Per HPI unless specifically indicated above      Objective:    BP 130/70 (BP Location: Left Arm, Patient Position: Sitting, Cuff Size: Normal)   Pulse 76   Ht 5\' 8"  (1.727 m)   Wt 161 lb (73 kg)   SpO2 100%   BMI 24.48 kg/m   Wt Readings from Last 3 Encounters:  03/31/23 161 lb (73 kg)  02/03/23 166 lb 9.6 oz (75.6 kg)  08/15/21 165 lb (74.8 kg)    Physical Exam Vitals and nursing note reviewed.  Constitutional:      General: He is not in acute distress.    Appearance: He is well-developed. He is not diaphoretic.     Comments: Well-appearing, comfortable, cooperative  HENT:     Head: Normocephalic and atraumatic.  Eyes:     General:        Right eye: No discharge.        Left eye: No discharge.     Conjunctiva/sclera: Conjunctivae normal.     Pupils: Pupils are equal, round, and reactive to light.  Neck:     Thyroid: No thyromegaly.     Vascular: No carotid bruit.  Cardiovascular:     Rate and Rhythm: Normal rate and regular rhythm.     Pulses:  Normal pulses.     Heart sounds: Normal heart sounds. No murmur heard. Pulmonary:     Effort: Pulmonary effort is normal. No respiratory distress.     Breath sounds: Normal breath sounds. No wheezing or rales.  Abdominal:     General: Bowel sounds are normal. There is no distension.     Palpations: Abdomen is soft. There is no mass.     Tenderness: There is no abdominal tenderness.  Musculoskeletal:        General: No tenderness. Normal range  of motion.     Cervical back: Normal range of motion and neck supple.     Right lower leg: No edema.     Left lower leg: No edema.     Comments: Upper / Lower Extremities: - Normal muscle tone, strength bilateral upper extremities 5/5, lower extremities 5/5  Lymphadenopathy:     Cervical: No cervical adenopathy.  Skin:    General: Skin is warm and dry.     Findings: No erythema or rash.  Neurological:     Mental Status: He is alert and oriented to person, place, and time.     Comments: Distal sensation intact to light touch all extremities  Psychiatric:        Mood and Affect: Mood normal.        Behavior: Behavior normal.        Thought Content: Thought content normal.     Comments: Well groomed, good eye contact, normal speech and thoughts       Results for orders placed or performed in visit on 03/24/23  Lipid panel  Result Value Ref Range   Cholesterol 187 <200 mg/dL   HDL 61 > OR = 40 mg/dL   Triglycerides 960 <454 mg/dL   LDL Cholesterol (Calc) 105 (H) mg/dL (calc)   Total CHOL/HDL Ratio 3.1 <5.0 (calc)   Non-HDL Cholesterol (Calc) 126 <130 mg/dL (calc)  CBC with Differential/Platelet  Result Value Ref Range   WBC 9.8 3.8 - 10.8 Thousand/uL   RBC 4.92 4.20 - 5.80 Million/uL   Hemoglobin 14.4 13.2 - 17.1 g/dL   HCT 09.8 11.9 - 14.7 %   MCV 87.0 80.0 - 100.0 fL   MCH 29.3 27.0 - 33.0 pg   MCHC 33.6 32.0 - 36.0 g/dL   RDW 82.9 56.2 - 13.0 %   Platelets 236 140 - 400 Thousand/uL   MPV 10.2 7.5 - 12.5 fL   Neutro Abs 5,988  1,500 - 7,800 cells/uL   Lymphs Abs 2,754 850 - 3,900 cells/uL   Absolute Monocytes 794 200 - 950 cells/uL   Eosinophils Absolute 206 15 - 500 cells/uL   Basophils Absolute 59 0 - 200 cells/uL   Neutrophils Relative % 61.1 %   Total Lymphocyte 28.1 %   Monocytes Relative 8.1 %   Eosinophils Relative 2.1 %   Basophils Relative 0.6 %  COMPLETE METABOLIC PANEL WITH GFR  Result Value Ref Range   Glucose, Bld 101 (H) 65 - 99 mg/dL   BUN 15 7 - 25 mg/dL   Creat 8.65 7.84 - 6.96 mg/dL   eGFR 295 > OR = 60 MW/UXL/2.44W1   BUN/Creatinine Ratio SEE NOTE: 6 - 22 (calc)   Sodium 142 135 - 146 mmol/L   Potassium 4.1 3.5 - 5.3 mmol/L   Chloride 104 98 - 110 mmol/L   CO2 30 20 - 32 mmol/L   Calcium 9.9 8.6 - 10.3 mg/dL   Total Protein 6.7 6.1 - 8.1 g/dL   Albumin 4.3 3.6 - 5.1 g/dL   Globulin 2.4 1.9 - 3.7 g/dL (calc)   AG Ratio 1.8 1.0 - 2.5 (calc)   Total Bilirubin 0.9 0.2 - 1.2 mg/dL   Alkaline phosphatase (APISO) 53 36 - 130 U/L   AST 18 10 - 40 U/L   ALT 17 9 - 46 U/L      Assessment & Plan:   Problem List Items Addressed This Visit   None Visit Diagnoses     Annual physical exam    -  Primary      Updated Health Maintenance information Reviewed recent lab results with patient Encouraged improvement to lifestyle with diet and exercise Goal maintain healthy weight     No orders of the defined types were placed in this encounter.    Follow up plan: Return in about 1 year (around 03/30/2024) for 1 year fasting lab only then 1 week later Annual Physical.  Future labs A1c CMET LIPID CBC  Saralyn Pilar, DO [facility] 03/31/2023, 3:56 PM

## 2024-03-30 ENCOUNTER — Other Ambulatory Visit: Payer: PRIVATE HEALTH INSURANCE

## 2024-03-30 DIAGNOSIS — E7800 Pure hypercholesterolemia, unspecified: Secondary | ICD-10-CM

## 2024-03-30 DIAGNOSIS — Z114 Encounter for screening for human immunodeficiency virus [HIV]: Secondary | ICD-10-CM

## 2024-03-30 DIAGNOSIS — Z7252 High risk homosexual behavior: Secondary | ICD-10-CM

## 2024-03-30 DIAGNOSIS — Z0000 Encounter for general adult medical examination without abnormal findings: Secondary | ICD-10-CM

## 2024-03-30 DIAGNOSIS — R7309 Other abnormal glucose: Secondary | ICD-10-CM

## 2024-03-31 LAB — CBC WITH DIFFERENTIAL/PLATELET
Absolute Lymphocytes: 2250 {cells}/uL (ref 850–3900)
Absolute Monocytes: 725 {cells}/uL (ref 200–950)
Basophils Absolute: 41 {cells}/uL (ref 0–200)
Basophils Relative: 0.7 %
Eosinophils Absolute: 81 {cells}/uL (ref 15–500)
Eosinophils Relative: 1.4 %
HCT: 40 % (ref 38.5–50.0)
Hemoglobin: 13.5 g/dL (ref 13.2–17.1)
MCH: 30.8 pg (ref 27.0–33.0)
MCHC: 33.8 g/dL (ref 32.0–36.0)
MCV: 91.3 fL (ref 80.0–100.0)
MPV: 9.6 fL (ref 7.5–12.5)
Monocytes Relative: 12.5 %
Neutro Abs: 2703 {cells}/uL (ref 1500–7800)
Neutrophils Relative %: 46.6 %
Platelets: 237 10*3/uL (ref 140–400)
RBC: 4.38 10*6/uL (ref 4.20–5.80)
RDW: 13.6 % (ref 11.0–15.0)
Total Lymphocyte: 38.8 %
WBC: 5.8 10*3/uL (ref 3.8–10.8)

## 2024-03-31 LAB — LIPID PANEL
Cholesterol: 185 mg/dL (ref <200)
HDL: 50 mg/dL (ref >=40)
LDL Cholesterol (Calc): 105 mg/dL — ABNORMAL HIGH
Non-HDL Cholesterol (Calc): 135 mg/dL — ABNORMAL HIGH (ref <130)
Total CHOL/HDL Ratio: 3.7 (calc) (ref <5.0)
Triglycerides: 179 mg/dL — ABNORMAL HIGH (ref <150)

## 2024-03-31 LAB — COMPLETE METABOLIC PANEL WITHOUT GFR
AG Ratio: 1.8 (calc) (ref 1.0–2.5)
ALT: 18 U/L (ref 9–46)
AST: 22 U/L (ref 10–40)
Albumin: 4.3 g/dL (ref 3.6–5.1)
Alkaline phosphatase (APISO): 61 U/L (ref 36–130)
BUN: 18 mg/dL (ref 7–25)
CO2: 28 mmol/L (ref 20–32)
Calcium: 9.7 mg/dL (ref 8.6–10.3)
Chloride: 104 mmol/L (ref 98–110)
Creat: 0.94 mg/dL (ref 0.60–1.24)
Globulin: 2.4 g/dL (ref 1.9–3.7)
Glucose, Bld: 111 mg/dL — ABNORMAL HIGH (ref 65–99)
Potassium: 4.4 mmol/L (ref 3.5–5.3)
Sodium: 140 mmol/L (ref 135–146)
Total Bilirubin: 0.8 mg/dL (ref 0.2–1.2)
Total Protein: 6.7 g/dL (ref 6.1–8.1)

## 2024-03-31 LAB — HIV ANTIBODY (ROUTINE TESTING W REFLEX): HIV 1&2 Ab, 4th Generation: NONREACTIVE

## 2024-03-31 LAB — HEMOGLOBIN A1C
Hgb A1c MFr Bld: 4.9 % (ref <5.7)
Mean Plasma Glucose: 94 mg/dL
eAG (mmol/L): 5.2 mmol/L

## 2024-04-06 ENCOUNTER — Other Ambulatory Visit: Payer: Self-pay | Admitting: Family Medicine

## 2024-04-06 ENCOUNTER — Encounter: Payer: Self-pay | Admitting: Family Medicine

## 2024-04-06 ENCOUNTER — Ambulatory Visit (INDEPENDENT_AMBULATORY_CARE_PROVIDER_SITE_OTHER): Payer: Self-pay | Admitting: Family Medicine

## 2024-04-06 VITALS — BP 122/78 | HR 70 | Ht 68.0 in | Wt 165.2 lb

## 2024-04-06 DIAGNOSIS — Z0000 Encounter for general adult medical examination without abnormal findings: Secondary | ICD-10-CM

## 2024-04-06 DIAGNOSIS — E7800 Pure hypercholesterolemia, unspecified: Secondary | ICD-10-CM

## 2024-04-06 DIAGNOSIS — R7309 Other abnormal glucose: Secondary | ICD-10-CM

## 2024-04-06 DIAGNOSIS — Z114 Encounter for screening for human immunodeficiency virus [HIV]: Secondary | ICD-10-CM

## 2024-04-06 DIAGNOSIS — Z7252 High risk homosexual behavior: Secondary | ICD-10-CM

## 2024-04-06 NOTE — Progress Notes (Signed)
 Subjective:    Patient ID: Fernando Solomon, male    DOB: Aug 19, 1996, 28 y.o.   MRN: [id number]  Fernando Solomon is a 28 y.o. male presenting on 04/06/2024 for Annual Exam   HPI  Discussed the use of AI scribe software for clinical note transcription with the patient, who gave verbal consent to proceed.  History of Present Illness   Fernando Solomon is a 28 year old male who presents for an annual physical exam.  He has been feeling well overall with no specific complaints. Recent blood work on Mar 30, 2024, showed stable LDL cholesterol at 105 mg/dL, increased triglycerides at 179 mg/dL, and an I9J of 1.8%. His glucose level was 111 mg/dL, which he attributes to his breakfast or non fasting state.  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 He is unsure if the bleeding is due to the fissure or possibly hemorrhoids.  He maintains a balanced diet and is physically active through his work. He sometimes skips breakfast but generally follows a balanced diet. He has not made any recent changes to his exercise regimen or diet.  He completed a mental health questionnaire and noted some sleep disturbances, which he attributes to the stress of purchasing a house. He feels he can manage this on his own.    Health Maintenance: UTD on vaccines, TDap      04/06/2024    8:13 AM 03/31/2023    3:38 PM 02/03/2023    1:35 PM  Depression screen PHQ 2/9  Decreased Interest 0 0 0  Down, Depressed, Hopeless 0 0 0  PHQ - 2 Score 0 0 0  Altered sleeping 2  0  Tired, decreased energy 1  0  Change in appetite 0  0  Feeling bad or failure about yourself  0  0  Trouble concentrating 0  0  Moving slowly or fidgety/restless 0  0  Suicidal thoughts 0  0  PHQ-9 Score 3  0  Difficult doing work/chores Not difficult at all  Not difficult at all       04/06/2024    8:13  AM 03/31/2023    3:38 PM 02/03/2023    1:36 PM  GAD 7 : Generalized Anxiety Score  Nervous, Anxious, on Edge 0 0 0  Control/stop worrying 0 0 0  Worry too much - different things 0 0 0  Trouble relaxing 1 0 0  Restless 0 0 0  Easily annoyed or irritable 0 0 0  Afraid - awful might happen 0 0 0  Total GAD 7 Score 1 0 0  Anxiety Difficulty Not difficult at all  Not difficult at all     History reviewed. No pertinent past medical history. History reviewed. No pertinent surgical history. Social History   Socioeconomic History   Marital status: Single    Spouse name: 0   Number of children: 0   Years of education: Not on file   Highest education level: Bachelor's degree (e.g., BA, AB, BS)  Occupational History   Occupation: Visual merchandiser  Tobacco Use   Smoking status: Never   Smokeless tobacco: Current    Types: Snuff  Vaping Use   Vaping status: Never Used  Substance and Sexual Activity   Alcohol use: No    Alcohol/week: 0.0 standard drinks of alcohol   Drug use: No    Comment: tried  pot   Sexual activity: Not Currently    Partners: Male    Birth control/protection: Condom  Other Topics Concern   Not on file  Social History Narrative   Started college Fall 2017 at [employer]  - [employer] [place]   Major in biology and minor in agriculture   Social Drivers of Health   Financial Resource Strain: Patient Declined (02/03/2023)   Overall Financial Resource Strain (CARDIA)    Difficulty of Paying Living Expenses: Patient declined  Food Insecurity: Patient Declined (02/03/2023)   Hunger Vital Sign    Worried About Running Out of Food in the Last Year: Patient declined    Ran Out of Food in the Last Year: Patient declined  Transportation Needs: No Transportation Needs (02/03/2023)   PRAPARE - Administrator, Civil Service (Medical): No    Lack of Transportation (Non-Medical): No  Physical Activity: Sufficiently Active (02/03/2023)   Exercise Vital Sign    Days of  Exercise per Week: 7 days    Minutes of Exercise per Session: 150+ min  Stress: No Stress Concern Present (02/03/2023)   [employer] of Occupational Health - Occupational Stress Questionnaire    Feeling of Stress : Only a little  Social Connections: Moderately Integrated (02/03/2023)   Social Connection and Isolation Panel [NHANES]    Frequency of Communication with Friends and Family: More than three times a week    Frequency of Social Gatherings with Friends and Family: Three times a week    Attends Religious Services: More than 4 times per year    Active Member of Clubs or Organizations: Yes    Attends Banker Meetings: More than 4 times per year    Marital Status: Never married  Intimate Partner Violence: Not At Risk (03/16/2018)   Humiliation, Afraid, Rape, and Kick questionnaire    Fear of Current or Ex-Partner: No    Emotionally Abused: No    Physically Abused: No    Sexually Abused: No   Family History  Problem Relation Age of Onset   Diabetes Father    Autism Brother    Lung cancer Maternal Grandfather    Current Outpatient Medications on File Prior to Visit  Medication Sig   diltiazem 2 % GEL Apply 1 Application topically 3 (three) times daily.   No current facility-administered medications on file prior to visit.    Review of Systems  Constitutional:  Negative for activity change, appetite change, chills, diaphoresis, fatigue and fever.  HENT:  Negative for congestion and hearing loss.   Eyes:  Negative for visual disturbance.  Respiratory:  Negative for cough, chest tightness, shortness of breath and wheezing.   Cardiovascular:  Negative for chest pain, palpitations and leg swelling.  Gastrointestinal:  Negative for abdominal pain, constipation, diarrhea, nausea and vomiting.  Genitourinary:  Negative for dysuria, frequency and hematuria.  Musculoskeletal:  Negative for arthralgias and neck pain.  Skin:  Negative for rash.  Neurological:  Negative  for dizziness, weakness, light-headedness, numbness and headaches.  Hematological:  Negative for adenopathy.  Psychiatric/Behavioral:  Negative for behavioral problems, dysphoric mood and sleep disturbance.    Per HPI unless specifically indicated above     Objective:     BP 122/78 (BP Location: Left Arm, Patient Position: Sitting, Cuff Size: Normal)   Pulse 70   Ht 5\' 8"  (1.727 m)   Wt 165 lb 4 oz (75 kg)   SpO2 100%   BMI 25.13 kg/m   Wt Readings from Last  3 Encounters:  04/06/24 165 lb 4 oz (75 kg)  03/31/23 161 lb (73 kg)  02/03/23 166 lb 9.6 oz (75.6 kg)    Physical Exam Vitals and nursing note reviewed.  Constitutional:      General: He is not in acute distress.    Appearance: He is well-developed. He is not diaphoretic.     Comments: Well-appearing, comfortable, cooperative  HENT:     Head: Normocephalic and atraumatic.     Right Ear: Tympanic membrane, ear canal and external ear normal. There is no impacted cerumen.     Left Ear: Tympanic membrane, ear canal and external ear normal. There is no impacted cerumen.  Eyes:     General:        Right eye: No discharge.        Left eye: No discharge.     Conjunctiva/sclera: Conjunctivae normal.     Pupils: Pupils are equal, round, and reactive to light.  Neck:     Thyroid: No thyromegaly.     Vascular: No carotid bruit.  Cardiovascular:     Rate and Rhythm: Normal rate and regular rhythm.     Pulses: Normal pulses.     Heart sounds: Normal heart sounds. No murmur heard. Pulmonary:     Effort: Pulmonary effort is normal. No respiratory distress.     Breath sounds: Normal breath sounds. No wheezing or rales.  Abdominal:     General: Bowel sounds are normal. There is no distension.     Palpations: Abdomen is soft. There is no mass.     Tenderness: There is no abdominal tenderness.  Musculoskeletal:        General: No tenderness. Normal range of motion.     Cervical back: Normal range of motion and neck supple.      Right lower leg: No edema.     Left lower leg: No edema.     Comments: Upper / Lower Extremities: - Normal muscle tone, strength bilateral upper extremities 5/5, lower extremities 5/5  Lymphadenopathy:     Cervical: No cervical adenopathy.  Skin:    General: Skin is warm and dry.     Findings: No erythema or rash.  Neurological:     Mental Status: He is alert and oriented to person, place, and time.     Comments: Distal sensation intact to light touch all extremities  Psychiatric:        Mood and Affect: Mood normal.        Behavior: Behavior normal.        Thought Content: Thought content normal.     Comments: Well groomed, good eye contact, normal speech and thoughts     Results for orders placed or performed in visit on 03/30/24  HIV Antibody (routine testing w rflx)   Collection Time: 03/30/24  8:07 AM  Result Value Ref Range   HIV 1&2 Ab, 4th Generation NON-REACTIVE NON-REACTIVE  Lipid panel   Collection Time: 03/30/24  8:07 AM  Result Value Ref Range   Cholesterol 185 <200 mg/dL   HDL 50 > OR = 40 mg/dL   Triglycerides 914 (H) <150 mg/dL   LDL Cholesterol (Calc) 105 (H) mg/dL (calc)   Total CHOL/HDL Ratio 3.7 <5.0 (calc)   Non-HDL Cholesterol (Calc) 135 (H) <130 mg/dL (calc)  Hemoglobin N8G   Collection Time: 03/30/24  8:07 AM  Result Value Ref Range   Hgb A1c MFr Bld 4.9 <5.7 %   Mean Plasma Glucose 94 mg/dL   eAG (  mmol/L) 5.2 mmol/L  CBC with Differential/Platelet   Collection Time: 03/30/24  8:07 AM  Result Value Ref Range   WBC 5.8 3.8 - 10.8 Thousand/uL   RBC 4.38 4.20 - 5.80 Million/uL   Hemoglobin 13.5 13.2 - 17.1 g/dL   HCT 16.1 09.6 - 04.5 %   MCV 91.3 80.0 - 100.0 fL   MCH 30.8 27.0 - 33.0 pg   MCHC 33.8 32.0 - 36.0 g/dL   RDW 40.9 81.1 - 91.4 %   Platelets 237 140 - 400 Thousand/uL   MPV 9.6 7.5 - 12.5 fL   Neutro Abs 2,703 1,500 - 7,800 cells/uL   Absolute Lymphocytes 2,250 850 - 3,900 cells/uL   Absolute Monocytes 725 200 - 950 cells/uL    Eosinophils Absolute 81 15 - 500 cells/uL   Basophils Absolute 41 0 - 200 cells/uL   Neutrophils Relative % 46.6 %   Total Lymphocyte 38.8 %   Monocytes Relative 12.5 %   Eosinophils Relative 1.4 %   Basophils Relative 0.7 %  COMPLETE METABOLIC PANEL WITH GFR   Collection Time: 03/30/24  8:07 AM  Result Value Ref Range   Glucose, Bld 111 (H) 65 - 99 mg/dL   BUN 18 7 - 25 mg/dL   Creat 7.82 9.56 - 2.13 mg/dL   BUN/Creatinine Ratio SEE NOTE: 6 - 22 (calc)   Sodium 140 135 - 146 mmol/L   Potassium 4.4 3.5 - 5.3 mmol/L   Chloride 104 98 - 110 mmol/L   CO2 28 20 - 32 mmol/L   Calcium 9.7 8.6 - 10.3 mg/dL   Total Protein 6.7 6.1 - 8.1 g/dL   Albumin 4.3 3.6 - 5.1 g/dL   Globulin 2.4 1.9 - 3.7 g/dL (calc)   AG Ratio 1.8 1.0 - 2.5 (calc)   Total Bilirubin 0.8 0.2 - 1.2 mg/dL   Alkaline phosphatase (APISO) 61 36 - 130 U/L   AST 22 10 - 40 U/L   ALT 18 9 - 46 U/L      Assessment & Plan:   Problem List Items Addressed This Visit   None Visit Diagnoses       Annual physical exam    -  Primary        Updated Health Maintenance information Reviewed recent lab results with patient Encouraged improvement to lifestyle with diet and exercise Goal of weight loss  History Anal fissure Intermittent rectal bleeding likely due to anal fissure or hemorrhoids. No significant digestive symptoms indicating serious condition. - Continue diltiazem gel as needed. Future re order if less effective or run low. - Consider earlier diagnostic evaluation if symptoms persist.  General Health Maintenance Routine health maintenance current. Labs show stable LDL, slightly elevated triglycerides, normal A1c. Renal and hepatic functions normal. Vitals within normal limits. Mental health and sleep well-managed. - Continue current health maintenance regimen. - Schedule next annual physical and labs for next year. - Encourage increased water intake.        No orders of the defined types were placed in  this encounter.   No orders of the defined types were placed in this encounter.    Follow up plan: Return in about 1 year (around 04/06/2025) for 1 year fasting lab > 1 week later Annual Physical.  04/05/25  Domingo Friend, DO [facility] 04/06/2024, 8:35 AM

## 2024-04-06 NOTE — Patient Instructions (Addendum)
 Thank you for coming to the office today.  All labs look great overall.  Mild elevated Triglyceride.  LDL 105 stable  Recent Labs    03/30/24 0807  HGBA1C 4.9   Excellent 3 month average blood sugar  DUE for FASTING BLOOD WORK (no food or drink after midnight before the lab appointment, only water or coffee without cream/sugar on the morning of)  SCHEDULE "Lab Only" visit in the morning at the clinic for lab draw in 1 YEAR  - Make sure Lab Only appointment is at about 1 week before your next appointment, so that results will be available  For Lab Results, once available within 2-3 days of blood draw, you can can log in to MyChart online to view your results and a brief explanation. Also, we can discuss results at next follow-up visit.    Please schedule a Follow-up Appointment to: Return in about 1 year (around 04/06/2025) for 1 year fasting lab > 1 week later Annual Physical.  If you have any other questions or concerns, please feel free to call the office or send a message through MyChart. You may also schedule an earlier appointment if necessary.  Additionally, you may be receiving a survey about your experience at our office within a few days to 1 week by e-mail or mail. We value your feedback.  Domingo Friend, DO [facility], [place]

## 2024-05-17 ENCOUNTER — Ambulatory Visit (INDEPENDENT_AMBULATORY_CARE_PROVIDER_SITE_OTHER): Payer: PRIVATE HEALTH INSURANCE | Admitting: Family Medicine

## 2024-05-17 ENCOUNTER — Other Ambulatory Visit: Payer: Self-pay

## 2024-05-17 ENCOUNTER — Encounter: Payer: Self-pay | Admitting: Family Medicine

## 2024-05-17 VITALS — BP 130/80 | HR 90 | Temp 99.7°F | Ht 68.0 in | Wt 164.2 lb

## 2024-05-17 DIAGNOSIS — J029 Acute pharyngitis, unspecified: Secondary | ICD-10-CM | POA: Diagnosis not present

## 2024-05-17 LAB — POCT RAPID STREP A (OFFICE): Rapid Strep A Screen: NEGATIVE

## 2024-05-17 MED ORDER — AMOXICILLIN 500 MG PO CAPS
500.0000 mg | ORAL_CAPSULE | Freq: Two times a day (BID) | ORAL | 0 refills | Status: AC
  Filled 2024-05-17: qty 20, 10d supply, fill #0

## 2024-05-17 NOTE — Progress Notes (Unsigned)
 Subjective:    Patient ID: Fernando Solomon, male    DOB: 11-Dec-1995, 28 y.o.   MRN: [id number]  Fernando Solomon is a 28 y.o. male presenting on 05/17/2024 for Sore Throat  Patient presents for a same day appointment.  HPI  Discussed the use of AI scribe software for clinical note transcription with the patient, who gave verbal consent to proceed.  History of Present Illness   Fernando Solomon is a 28 year old male who presents with a sore throat and lesions on his tonsils.  Pharyngitis and tonsillar lesions - Sore throat onset after contact with a friend and his children diagnosed with streptococcal pharyngitis - He has white patches on both tonsils and redness swelling - Attempted to scrape tonsils, with sterile instrument, resulting in removal of some material - No respiratory symptoms - No ear pain  Constitutional symptoms - Poor sleep quality - Alternating hot and cold sensations overnight - Sweating during the night  Neck pain and stiffness - Soreness and stiffness in the neck  Infection exposure and transmission concerns - Recent exposure to individuals with streptococcal pharyngitis - Concerned about spreading illness to coworkers          05/17/2024    3:15 PM 04/06/2024    8:13 AM 03/31/2023    3:38 PM  Depression screen PHQ 2/9  Decreased Interest 0 0 0  Down, Depressed, Hopeless 0 0 0  PHQ - 2 Score 0 0 0  Altered sleeping 1 2   Tired, decreased energy 1 1   Change in appetite 0 0   Feeling bad or failure about yourself  0 0   Trouble concentrating 0 0   Moving slowly or fidgety/restless 0 0   Suicidal thoughts 0 0   PHQ-9 Score 2 3   Difficult doing work/chores Not difficult at all Not difficult at all        05/17/2024    3:15 PM 04/06/2024    8:13 AM 03/31/2023    3:38 PM 02/03/2023    1:36 PM  GAD 7 : Generalized Anxiety Score  Nervous, Anxious, on Edge 0 0 0 0  Control/stop worrying 0 0 0 0  Worry too much - different things 0 0 0 0  Trouble  relaxing 0 1 0 0  Restless 0 0 0 0  Easily annoyed or irritable 0 0 0 0  Afraid - awful might happen 0 0 0 0  Total GAD 7 Score 0 1 0 0  Anxiety Difficulty Not difficult at all Not difficult at all  Not difficult at all    Social History   Tobacco Use   Smoking status: Never   Smokeless tobacco: Current    Types: Snuff  Vaping Use   Vaping status: Never Used  Substance Use Topics   Alcohol use: No    Alcohol/week: 0.0 standard drinks of alcohol   Drug use: No    Comment: tried pot    Review of Systems Per HPI unless specifically indicated above     Objective:    BP 130/80 (BP Location: Left Arm, Patient Position: Sitting, Cuff Size: Normal)   Pulse 90   Temp 99.7 F (37.6 C) (Oral)   Ht 5' 8 (1.727 m)   Wt 164 lb 3.2 oz (74.5 kg)   SpO2 99%   BMI 24.97 kg/m   Wt Readings from Last 3 Encounters:  05/17/24 164 lb 3.2 oz (74.5 kg)  04/06/24 165 lb 4 oz (75  kg)  03/31/23 161 lb (73 kg)    Physical Exam Vitals and nursing note reviewed.  Constitutional:      General: He is not in acute distress.    Appearance: Normal appearance. He is well-developed. He is not diaphoretic.     Comments: Well-appearing, comfortable, cooperative  HENT:     Head: Normocephalic and atraumatic.     Right Ear: Tympanic membrane, ear canal and external ear normal. There is no impacted cerumen.     Left Ear: Tympanic membrane, ear canal and external ear normal. There is no impacted cerumen.     Mouth/Throat:     Mouth: Mucous membranes are moist.     Pharynx: Oropharyngeal exudate (bilateral tonsils) and posterior oropharyngeal erythema (bilateral tonsils) present.  Eyes:     General:        Right eye: No discharge.        Left eye: No discharge.     Conjunctiva/sclera: Conjunctivae normal.  Cardiovascular:     Rate and Rhythm: Normal rate.  Pulmonary:     Effort: Pulmonary effort is normal. No respiratory distress.     Breath sounds: Normal breath sounds. No wheezing, rhonchi or  rales.  Lymphadenopathy:     Cervical: No cervical adenopathy.  Skin:    General: Skin is warm and dry.     Findings: No erythema or rash.  Neurological:     Mental Status: He is alert and oriented to person, place, and time.  Psychiatric:        Mood and Affect: Mood normal.        Behavior: Behavior normal.        Thought Content: Thought content normal.     Comments: Well groomed, good eye contact, normal speech and thoughts     Results for orders placed or performed in visit on 05/17/24  POCT rapid strep A   Collection Time: 05/17/24  2:10 PM  Result Value Ref Range   Rapid Strep A Screen Negative Negative      Assessment & Plan:   Problem List Items Addressed This Visit   None Visit Diagnoses       Acute pharyngitis, unspecified etiology    -  Primary   Relevant Medications   amoxicillin  (AMOXIL ) 500 MG capsule   Other Relevant Orders   POCT rapid strep A (Completed)       Acute Pharyngitis Acute sore throat with exposure to confirmed streptococcal infection. Symptoms and exposure suggest bacterial infection despite inconclusive rapid strep test. Amoxicillin  is suitable as he is not allergic. - Prescribed amoxicillin  500 mg twice daily for 10 days. - Recommended ibuprofen for analgesia. - Advised monitoring symptoms and to contact office if no improvement after 7 days or if symptoms worsen. Would consider switch of antibiotic if still has exudates, suggest Doxycycline as next option if needed. - Sent prescription to [facility] if need can send orders to [facility]         Orders Placed This Encounter  Procedures   POCT rapid strep A    Meds ordered this encounter  Medications   amoxicillin  (AMOXIL ) 500 MG capsule    Sig: Take 1 capsule (500 mg total) by mouth 2 (two) times daily. For 10 days    Dispense:  20 capsule    Refill:  0    Follow up plan: Return if symptoms worsen or fail to improve.    Marsa Officer, DO [facility] 05/18/2024, 2:14 PM

## 2024-05-17 NOTE — Patient Instructions (Addendum)
 Thank you for coming to the office today.   We will cover you for Strep Throat with antibiotics, take Amoxicillin  500mg  twice a day for 10 days. We will send swab for a culture, and notify you next week if we need to change antibiotics.  - Take regular NSAID with either Aleve 1-2 pills twice a day OR Ibuprofen 400 to 600mg  per dose with food every 6-8 hours or 3 times a day for next 3 to 5 days regularly, then as needed only - Take Tylenol 500-1000mg  per dose as needed every 8 hours or 3 times a day between for pain, fevers, or chills - Drink extra clear fluids (water, or G2 gatorade), try colder soft foods if needed otherwise regular diet - Drink warm herbal tea with honey for sore throat   There is a chance that this is more of a Viral Pharyngitis, in which case it will take time to run it's course regardless, and may have some hoarseness or throat pain lasting for up to 7-10 days then improve    Please schedule a Follow-up Appointment to: Return if symptoms worsen or fail to improve.  If you have any other questions or concerns, please feel free to call the office or send a message through MyChart. You may also schedule an earlier appointment if necessary.  Additionally, you may be receiving a survey about your experience at our office within a few days to 1 week by e-mail or mail. We value your feedback.  Marsa Officer, DO [facility], [place]

## 2025-04-05 ENCOUNTER — Other Ambulatory Visit: Payer: PRIVATE HEALTH INSURANCE

## 2025-04-07 ENCOUNTER — Other Ambulatory Visit: Payer: PRIVATE HEALTH INSURANCE

## 2025-04-12 ENCOUNTER — Encounter: Payer: PRIVATE HEALTH INSURANCE | Admitting: Family Medicine
